# Patient Record
Sex: Male | Born: 1985 | Race: Black or African American | Hispanic: No | Marital: Single | Smoking: Current some day smoker
Health system: Southern US, Community
[De-identification: ages and names within clinical notes are randomized; demographics above are authoritative.]

## PROBLEM LIST (undated history)

## (undated) DIAGNOSIS — F319 Bipolar disorder, unspecified: Secondary | ICD-10-CM

---
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
## 2005-06-23 ENCOUNTER — Emergency Department (HOSPITAL_COMMUNITY): Admission: EM | Admit: 2005-06-23 | Discharge: 2005-06-23 | Payer: Self-pay | Admitting: Emergency Medicine

## 2005-11-12 ENCOUNTER — Emergency Department (HOSPITAL_COMMUNITY): Admission: EM | Admit: 2005-11-12 | Discharge: 2005-11-12 | Payer: Self-pay | Admitting: Family Medicine

## 2007-02-11 ENCOUNTER — Emergency Department (HOSPITAL_COMMUNITY): Admission: EM | Admit: 2007-02-11 | Discharge: 2007-02-11 | Payer: Self-pay | Admitting: Emergency Medicine

## 2007-02-12 ENCOUNTER — Emergency Department (HOSPITAL_COMMUNITY): Admission: EM | Admit: 2007-02-12 | Discharge: 2007-02-12 | Payer: Self-pay | Admitting: Emergency Medicine

## 2008-09-05 ENCOUNTER — Emergency Department (HOSPITAL_COMMUNITY): Admission: EM | Admit: 2008-09-05 | Discharge: 2008-09-05 | Payer: Self-pay | Admitting: Family Medicine

## 2010-05-22 ENCOUNTER — Emergency Department (HOSPITAL_COMMUNITY)
Admission: EM | Admit: 2010-05-22 | Discharge: 2010-05-22 | Payer: Self-pay | Source: Home / Self Care | Admitting: Emergency Medicine

## 2010-12-16 ENCOUNTER — Emergency Department (HOSPITAL_COMMUNITY): Payer: Self-pay

## 2010-12-16 ENCOUNTER — Emergency Department (HOSPITAL_COMMUNITY)
Admission: EM | Admit: 2010-12-16 | Discharge: 2010-12-16 | Disposition: A | Discharge status: Home / Self Care | Payer: Self-pay | Attending: Emergency Medicine | Admitting: Emergency Medicine

## 2010-12-16 DIAGNOSIS — R5383 Other fatigue: Secondary | ICD-10-CM | POA: Insufficient documentation

## 2010-12-16 DIAGNOSIS — R109 Unspecified abdominal pain: Secondary | ICD-10-CM | POA: Insufficient documentation

## 2010-12-16 DIAGNOSIS — R070 Pain in throat: Secondary | ICD-10-CM | POA: Insufficient documentation

## 2010-12-16 DIAGNOSIS — R5381 Other malaise: Secondary | ICD-10-CM | POA: Insufficient documentation

## 2010-12-16 DIAGNOSIS — IMO0001 Reserved for inherently not codable concepts without codable children: Secondary | ICD-10-CM | POA: Insufficient documentation

## 2010-12-16 DIAGNOSIS — R079 Chest pain, unspecified: Secondary | ICD-10-CM | POA: Insufficient documentation

## 2010-12-16 DIAGNOSIS — R509 Fever, unspecified: Secondary | ICD-10-CM | POA: Insufficient documentation

## 2010-12-16 DIAGNOSIS — N509 Disorder of male genital organs, unspecified: Secondary | ICD-10-CM | POA: Insufficient documentation

## 2010-12-16 DIAGNOSIS — R1310 Dysphagia, unspecified: Secondary | ICD-10-CM | POA: Insufficient documentation

## 2010-12-16 LAB — CBC
MCH: 31 pg (ref 26.0–34.0)
MCV: 89.5 fL (ref 78.0–100.0)
Platelets: 262 10*3/uL (ref 150–400)
RDW: 14 % (ref 11.5–15.5)

## 2010-12-16 LAB — BASIC METABOLIC PANEL
CO2: 29 mEq/L (ref 19–32)
Calcium: 9.4 mg/dL (ref 8.4–10.5)
Creatinine, Ser: 1.07 mg/dL (ref 0.50–1.35)
GFR calc Af Amer: >60 mL/min (ref >60)

## 2010-12-16 LAB — DIFFERENTIAL
Eosinophils Absolute: 0 10*3/uL (ref 0.0–0.7)
Eosinophils Relative: 0 % (ref 0–5)
Lymphs Abs: 0.5 10*3/uL — ABNORMAL LOW (ref 0.7–4.0)
Monocytes Absolute: 0.7 10*3/uL (ref 0.1–1.0)
Monocytes Relative: 9 % (ref 3–12)

## 2010-12-16 LAB — URINALYSIS, ROUTINE W REFLEX MICROSCOPIC
Hgb urine dipstick: NEGATIVE
Protein, ur: NEGATIVE mg/dL
Urobilinogen, UA: 1 mg/dL (ref 0.0–1.0)

## 2011-03-27 ENCOUNTER — Emergency Department (HOSPITAL_COMMUNITY): Payer: No Typology Code available for payment source

## 2011-03-27 ENCOUNTER — Encounter: Payer: Self-pay | Admitting: Emergency Medicine

## 2011-03-27 ENCOUNTER — Emergency Department (HOSPITAL_COMMUNITY)
Admission: EM | Admit: 2011-03-27 | Discharge: 2011-03-27 | Disposition: A | Discharge status: Home / Self Care | Payer: No Typology Code available for payment source | Attending: Emergency Medicine | Admitting: Emergency Medicine

## 2011-03-27 DIAGNOSIS — F17200 Nicotine dependence, unspecified, uncomplicated: Secondary | ICD-10-CM | POA: Insufficient documentation

## 2011-03-27 DIAGNOSIS — M25519 Pain in unspecified shoulder: Secondary | ICD-10-CM | POA: Insufficient documentation

## 2011-03-27 DIAGNOSIS — F319 Bipolar disorder, unspecified: Secondary | ICD-10-CM | POA: Insufficient documentation

## 2011-03-27 DIAGNOSIS — S42123A Displaced fracture of acromial process, unspecified shoulder, initial encounter for closed fracture: Secondary | ICD-10-CM | POA: Insufficient documentation

## 2011-03-27 HISTORY — DX: Bipolar disorder, unspecified: F31.9

## 2011-03-27 MED ORDER — OXYCODONE-ACETAMINOPHEN 5-325 MG PO TABS: 1.0000 | ORAL_TABLET | Freq: Four times a day (QID) | ORAL | Status: AC | PRN

## 2011-03-27 MED ORDER — CYCLOBENZAPRINE HCL 10 MG PO TABS: 10.0000 mg | ORAL_TABLET | Freq: Two times a day (BID) | ORAL | Status: AC | PRN

## 2011-03-27 MED ORDER — OXYCODONE-ACETAMINOPHEN 5-325 MG PO TABS: 1.0000 | ORAL_TABLET | Freq: Four times a day (QID) | ORAL | Status: DC | PRN

## 2011-03-27 NOTE — ED Notes (Signed)
Pt NAD, resp e/u, AOx4, ambulatory with steady gait. Ortho tech place right arm sliing on pt. Pt tolerated well. Pt states understanding of discharge instructions and denies questions at time of discharge.
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
## 2011-03-27 NOTE — ED Provider Notes (Signed)
Medical screening examination/treatment/procedure(s) were performed by non-physician practitioner and as supervising physician I was immediately available for consultation/collaboration.   Laray Anger, DO 03/27/11 2356

## 2011-03-27 NOTE — Progress Notes (Signed)
Orthopedic Tech Progress Note Patient Details:  Bob Barnett 12-19-1985 [id number]  Other Ortho Devices Type of Ortho Device: Other (comment) (sling) Ortho Device Location: left arm Ortho Device Interventions: Application   Jasper Hanf 03/27/2011, 7:30 PM
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
## 2012-08-27 ENCOUNTER — Emergency Department (HOSPITAL_COMMUNITY)
Admission: EM | Admit: 2012-08-27 | Discharge: 2012-08-27 | Disposition: A | Discharge status: Home / Self Care | Payer: Self-pay | Attending: Emergency Medicine | Admitting: Emergency Medicine

## 2012-08-27 ENCOUNTER — Encounter (HOSPITAL_COMMUNITY): Payer: Self-pay | Admitting: Emergency Medicine

## 2012-08-27 DIAGNOSIS — IMO0002 Reserved for concepts with insufficient information to code with codable children: Secondary | ICD-10-CM | POA: Insufficient documentation

## 2012-08-27 DIAGNOSIS — Y9389 Activity, other specified: Secondary | ICD-10-CM | POA: Insufficient documentation

## 2012-08-27 DIAGNOSIS — Z8659 Personal history of other mental and behavioral disorders: Secondary | ICD-10-CM | POA: Insufficient documentation

## 2012-08-27 DIAGNOSIS — X503XXA Overexertion from repetitive movements, initial encounter: Secondary | ICD-10-CM | POA: Insufficient documentation

## 2012-08-27 DIAGNOSIS — Y92009 Unspecified place in unspecified non-institutional (private) residence as the place of occurrence of the external cause: Secondary | ICD-10-CM | POA: Insufficient documentation

## 2012-08-27 DIAGNOSIS — M549 Dorsalgia, unspecified: Secondary | ICD-10-CM

## 2012-08-27 DIAGNOSIS — F17200 Nicotine dependence, unspecified, uncomplicated: Secondary | ICD-10-CM | POA: Insufficient documentation

## 2012-08-27 MED ORDER — IBUPROFEN 800 MG PO TABS: 800.0000 mg | ORAL_TABLET | Freq: Three times a day (TID) | ORAL | Status: DC

## 2012-08-27 MED ORDER — CYCLOBENZAPRINE HCL 10 MG PO TABS: 10.0000 mg | ORAL_TABLET | Freq: Two times a day (BID) | ORAL | Status: DC | PRN

## 2012-08-27 NOTE — ED Provider Notes (Signed)
History     CSN: [id number]  Arrival date & time 08/27/12  4098   First MD Initiated Contact with Patient 08/27/12 1116      Chief Complaint  Patient presents with  . Back Pain    (Consider location/radiation/quality/duration/timing/severity/associated sxs/prior treatment) HPI Comments: Patient is a 27 year old male with no significant past medical history who presents for thoracic back pain with onset 3 days ago. Patient states that he was mowing the lawn and heard a pop in his back while pushing a lawnmower. Patient states that he has had an intermittent sharp pain in his mid back that radiates up and down his back; worse with movement and improved with rest. Patient denies direct trauma to his back, saddle anesthesia, as well as bowel or bladder incontinence. Patient has been ambulatory since the incident. He denies fever, chest pain, shortness of breath, nausea or vomiting, abdominal pain, and numbness or tingling in his extremities.  Patient is a 27 y.o. male presenting with back pain. The history is provided by the patient. No language interpreter was used.  Back Pain Associated symptoms: no fever, no numbness and no weakness     Past Medical History  Diagnosis Date  . Bipolar 1 disorder     History reviewed. No pertinent past surgical history.  History reviewed. No pertinent family history.  History  Substance Use Topics  . Smoking status: Current Some Day Smoker  . Smokeless tobacco: Not on file  . Alcohol Use: Yes     Comment: social      Review of Systems  Constitutional: Negative for fever.  Musculoskeletal: Positive for back pain. Negative for joint swelling and gait problem.  Neurological: Negative for weakness and numbness.  All other systems reviewed and are negative.    Allergies  Review of patient's allergies indicates no known allergies.  Home Medications   Current Outpatient Rx  Name  Route  Sig  Dispense  Refill  . cyclobenzaprine (FLEXERIL)  10 MG tablet   Oral   Take 1 tablet (10 mg total) by mouth 2 (two) times daily as needed for muscle spasms.   20 tablet   0   . ibuprofen (ADVIL,MOTRIN) 800 MG tablet   Oral   Take 1 tablet (800 mg total) by mouth 3 (three) times daily.   21 tablet   0     BP 123/76  Pulse 66  Temp(Src) 98.1 F (36.7 C) (Oral)  Resp 18  SpO2 100%  Physical Exam  Nursing note and vitals reviewed. Constitutional: He is oriented to person, place, and time. He appears well-developed and well-nourished. No distress.  HENT:  Head: Normocephalic and atraumatic.  Eyes: Conjunctivae are normal. No scleral icterus.  Neck: Normal range of motion. Neck supple.  Cardiovascular: Normal rate, regular rhythm and intact distal pulses.   Pulmonary/Chest: Effort normal. No respiratory distress.  Musculoskeletal:  Patient has tenderness to palpation of his mid thoracic back as well as tenderness to palpation of his thoracic paraspinal muscles. He exhibits no decreased range of motion, able to demonstrate full flexion, extension, lateral bending, and twisting of his upper body. No bony step-offs or palpable deformities appreciated.  Neurological: He is alert and oriented to person, place, and time.  No sensory or motor deficits appreciated. DTRs normal and symmetric.  Skin: Skin is warm and dry. No rash noted. He is not diaphoretic. No erythema.  Psychiatric: He has a normal mood and affect. His behavior is normal.    ED Course  Procedures (including critical care time)  Labs Reviewed - No data to display No results found.   1. Back pain, acute     MDM  Uncomplicated mid back pain x3 days. Patient is neurovascularly intact with full range of motion of his mid and low back. No palpable deformities or step-offs appreciated and patient is ambulatory with normal gait. No sensory or motor deficits appreciated in DTRs normal and symmetric. Given physical exam findings, low suspicion for fracture and do not  believe imaging is warranted at this time; no hx of direct trauma. Patient is stable for discharge with orthopedic followup as well as ice and ibuprofen; Flexeril given as needed for symptoms. Indications for ED return discussed. Patient states comfort and understanding with this discharge plan with no unaddressed concerns.        Antony Madura, PA-C 08/27/12 1152

## 2012-08-27 NOTE — ED Notes (Signed)
Patient states he was doing yardwork on Saturday and heard his back pop.  Patient c/o mid-thoracic pain.  No other associated symptoms reported.

## 2012-08-28 NOTE — ED Provider Notes (Signed)
Medical screening examination/treatment/procedure(s) were performed by non-physician practitioner and as supervising physician I was immediately available for consultation/collaboration.    Vida Roller, MD 08/28/12 [phone]
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
## 2013-12-08 ENCOUNTER — Emergency Department (INDEPENDENT_AMBULATORY_CARE_PROVIDER_SITE_OTHER)
Admission: EM | Admit: 2013-12-08 | Discharge: 2013-12-08 | Disposition: A | Discharge status: Home / Self Care | Payer: Self-pay | Source: Home / Self Care | Attending: Family Medicine | Admitting: Family Medicine

## 2013-12-08 ENCOUNTER — Encounter (HOSPITAL_COMMUNITY): Payer: Self-pay | Admitting: Emergency Medicine

## 2013-12-08 DIAGNOSIS — S01511A Laceration without foreign body of lip, initial encounter: Secondary | ICD-10-CM

## 2013-12-08 DIAGNOSIS — S01501A Unspecified open wound of lip, initial encounter: Secondary | ICD-10-CM

## 2013-12-08 NOTE — Discharge Instructions (Signed)
Thank you for coming in today. Come back as needed.    Facial Laceration A facial laceration is a cut on the face. These injuries can be painful and cause bleeding. Some cuts may need to be closed with stitches (sutures), skin adhesive strips, or wound glue. Cuts usually heal quickly but can leave a scar. It can take 1-2 years for the scar to go away completely. HOME CARE   Only take medicines as told by your doctor.  Follow your doctor's instructions for wound care. For Stitches:  Keep the cut clean and dry.  If you have a bandage (dressing), change it at least once a day. Change the bandage if it gets wet or dirty, or as told by your doctor.  Wash the cut with soap and water 2 times a day. Rinse the cut with water. Pat it dry with a clean towel.  Put a thin layer of medicated cream on the cut as told by your doctor.  You may shower after the first 24 hours. Do not soak the cut in water until the stitches are removed.  Have your stitches removed as told by your doctor.  Do not wear any makeup until a few days after your stitches are removed. For Skin Adhesive Strips:  Keep the cut clean and dry.  Do not get the strips wet. You may take a bath, but be careful to keep the cut dry.  If the cut gets wet, pat it dry with a clean towel.  The strips will fall off on their own. Do not remove the strips that are still stuck to the cut. For Wound Glue:  You may shower or take baths. Do not soak or scrub the cut. Do not swim. Avoid heavy sweating until the glue falls off on its own. After a shower or bath, pat the cut dry with a clean towel.  Do not put medicine or makeup on your cut until the glue falls off.  If you have a bandage, do not put tape over the glue.  Avoid lots of sunlight or tanning lamps until the glue falls off.  The glue will fall off on its own in 5-10 days. Do not pick at the glue. After Healing: Put sunscreen on the cut for the first year to reduce your  scar. GET HELP RIGHT AWAY IF:   Your cut area gets red, painful, or puffy (swollen).  You see a yellowish-white fluid (pus) coming from the cut.  You have chills or a fever. MAKE SURE YOU:   Understand these instructions.  Will watch your condition.  Will get help right away if you are not doing well or get worse. Document Released: 10/05/2007 Document Revised: 02/06/2013 Document Reviewed: 11/29/2012 [facility] Patient Information 2015 [employer], [place]. This information is not intended to replace advice given to you by your health care provider. Make sure you discuss any questions you have with your health care provider.

## 2013-12-08 NOTE — ED Provider Notes (Signed)
Bob Barnett is a 28 y.o. male who presents to Urgent Care today for lip laceration. Patient was elbowed in the right upper lip today while playing basketball. He feels well otherwise. He denies any fever or chills. Tetanus show recently.    Past Medical History  Diagnosis Date  . Bipolar 1 disorder    History  Substance Use Topics  . Smoking status: Current Some Day Smoker  . Smokeless tobacco: Not on file  . Alcohol Use: Yes     Comment: social   ROS as above Medications: No current facility-administered medications for this encounter.   Current Outpatient Prescriptions  Medication Sig Dispense Refill  . cyclobenzaprine (FLEXERIL) 10 MG tablet Take 1 tablet (10 mg total) by mouth 2 (two) times daily as needed for muscle spasms.  20 tablet  0  . ibuprofen (ADVIL,MOTRIN) 800 MG tablet Take 1 tablet (800 mg total) by mouth 3 (three) times daily.  21 tablet  0    Exam:  BP 121/81  Pulse 68  Temp(Src) 98.3 F (36.8 C) (Oral)  SpO2 99% Gen: Well NAD Skin: <1cm laceration right lateral upper lip. Crosses the [place]. Shallow. No bleeding.   Laceration Repair: Area cleaned.  Dermabond applied and allowed to dry  Pt tolerated the procedure well.   No results found for this or any previous visit (from the past 24 hour(s)). No results found.  Assessment and Plan: 28 y.o. male with Right lip laceration repaired with dermabond.  F/u PRN  Discussed warning signs or symptoms. Please see discharge instructions. Patient expresses understanding.   This note was created using Conservation officer, historic buildingsDragon voice recognition software. Any transcription errors are unintended.    [name], MD 12/08/13 [phone]

## 2013-12-08 NOTE — ED Notes (Signed)
Patient c/o lip laceration to right upper corner of lip onset today. Patient reports he was elbowed in the face while playing basketball. Patient is alert and oriented and in no acute distress.

## 2014-12-27 ENCOUNTER — Encounter (HOSPITAL_COMMUNITY): Payer: Self-pay

## 2014-12-27 ENCOUNTER — Emergency Department (HOSPITAL_COMMUNITY)
Admission: EM | Admit: 2014-12-27 | Discharge: 2014-12-27 | Disposition: A | Discharge status: Home / Self Care | Payer: No Typology Code available for payment source | Attending: Emergency Medicine | Admitting: Emergency Medicine

## 2014-12-27 DIAGNOSIS — Z720 Tobacco use: Secondary | ICD-10-CM | POA: Diagnosis not present

## 2014-12-27 DIAGNOSIS — Y9389 Activity, other specified: Secondary | ICD-10-CM | POA: Insufficient documentation

## 2014-12-27 DIAGNOSIS — S199XXA Unspecified injury of neck, initial encounter: Secondary | ICD-10-CM | POA: Diagnosis not present

## 2014-12-27 DIAGNOSIS — S3992XA Unspecified injury of lower back, initial encounter: Secondary | ICD-10-CM | POA: Insufficient documentation

## 2014-12-27 DIAGNOSIS — Y92410 Unspecified street and highway as the place of occurrence of the external cause: Secondary | ICD-10-CM | POA: Insufficient documentation

## 2014-12-27 DIAGNOSIS — Y998 Other external cause status: Secondary | ICD-10-CM | POA: Insufficient documentation

## 2014-12-27 DIAGNOSIS — Z8659 Personal history of other mental and behavioral disorders: Secondary | ICD-10-CM | POA: Diagnosis not present

## 2014-12-27 DIAGNOSIS — Z791 Long term (current) use of non-steroidal anti-inflammatories (NSAID): Secondary | ICD-10-CM | POA: Diagnosis not present

## 2014-12-27 MED ORDER — CYCLOBENZAPRINE HCL 10 MG PO TABS: 10.0000 mg | ORAL_TABLET | Freq: Two times a day (BID) | ORAL | Status: DC | PRN

## 2014-12-27 MED ORDER — IBUPROFEN 800 MG PO TABS: 800.0000 mg | ORAL_TABLET | Freq: Three times a day (TID) | ORAL | Status: AC

## 2014-12-27 NOTE — Discharge Instructions (Signed)
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
## 2015-11-30 ENCOUNTER — Encounter (HOSPITAL_COMMUNITY): Payer: Self-pay | Admitting: Emergency Medicine

## 2015-11-30 ENCOUNTER — Emergency Department (HOSPITAL_COMMUNITY)
Admission: EM | Admit: 2015-11-30 | Discharge: 2015-11-30 | Disposition: A | Discharge status: Home / Self Care | Payer: No Typology Code available for payment source | Attending: Emergency Medicine | Admitting: Emergency Medicine

## 2015-11-30 DIAGNOSIS — R51 Headache: Secondary | ICD-10-CM | POA: Diagnosis not present

## 2015-11-30 DIAGNOSIS — F17200 Nicotine dependence, unspecified, uncomplicated: Secondary | ICD-10-CM | POA: Insufficient documentation

## 2015-11-30 DIAGNOSIS — Y999 Unspecified external cause status: Secondary | ICD-10-CM | POA: Insufficient documentation

## 2015-11-30 DIAGNOSIS — Y92410 Unspecified street and highway as the place of occurrence of the external cause: Secondary | ICD-10-CM | POA: Diagnosis not present

## 2015-11-30 DIAGNOSIS — S3992XA Unspecified injury of lower back, initial encounter: Secondary | ICD-10-CM | POA: Diagnosis present

## 2015-11-30 DIAGNOSIS — S39012A Strain of muscle, fascia and tendon of lower back, initial encounter: Secondary | ICD-10-CM | POA: Diagnosis not present

## 2015-11-30 DIAGNOSIS — Y939 Activity, unspecified: Secondary | ICD-10-CM | POA: Insufficient documentation

## 2015-11-30 MED ORDER — ACETAMINOPHEN 500 MG PO TABS
1000.0000 mg | ORAL_TABLET | Freq: Once | ORAL | Status: AC
  Administered 2015-11-30: 1000 mg via ORAL
  Filled 2015-11-30: qty 2
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
## 2015-11-30 NOTE — Discharge Instructions (Signed)
Take Tylenol for pain as directed. See an urgent care center if you continue to have significant pain in 3 or 4 days. Return if concern for any reason.

## 2022-05-05 ENCOUNTER — Encounter (HOSPITAL_COMMUNITY): Payer: Self-pay | Admitting: *Deleted

## 2022-05-05 ENCOUNTER — Ambulatory Visit (HOSPITAL_COMMUNITY)
Admission: EM | Admit: 2022-05-05 | Discharge: 2022-05-05 | Disposition: A | Discharge status: Home / Self Care | Payer: Self-pay

## 2022-05-05 DIAGNOSIS — K469 Unspecified abdominal hernia without obstruction or gangrene: Secondary | ICD-10-CM

## 2022-05-05 NOTE — ED Triage Notes (Signed)
Pt states that he had lower right sided abdominal pain. The pain radiates from his LLQ to his groin. He seen a lump/bulge on his LLQ and he pushed and the lump went back in and the pain was gone. He looked it up and he feels he has a hernia. He does do a lot pf lifting heavy things at work.

## 2022-05-05 NOTE — ED Provider Notes (Signed)
[facility]    CSN: [id number] Arrival date & time: 05/05/22  0855     History   Chief Complaint Chief Complaint  Patient presents with   Abdominal Pain    HPI Bob Barnett is a 37 y.o. male.  Presents with several week history of bulge in the right lower abdomen/groin He feels it sometimes extends into his testicle. Reports over the last week it has been a little painful.  He has been able to push it back in each time and the pain goes away. He does a lot of heavy lifting at work which he feels aggravates his symptoms. Denies any fevers.  No abdominal pain, nausea, vomiting, constipation. Not having pain today  Denies history of hernia.  Past Medical History:  Diagnosis Date   Bipolar 1 disorder ([facility])     There are no problems to display for this patient.   History reviewed. No pertinent surgical history.   Home Medications    Prior to Admission medications   Medication Sig Start Date End Date Taking? Authorizing Provider  ARIPiprazole (ABILIFY) 5 MG tablet Take 5 mg by mouth daily.    [provider]  cyclobenzaprine (FLEXERIL) 10 MG tablet Take 1 tablet (10 mg total) by mouth 2 (two) times daily as needed for muscle spasms. 12/27/14   Domenic Moras, PA-C  ibuprofen (ADVIL,MOTRIN) 800 MG tablet Take 1 tablet (800 mg total) by mouth 3 (three) times daily. 12/27/14   Domenic Moras, PA-C    Family History History reviewed. No pertinent family history.  Social History Social History   Tobacco Use   Smoking status: Some Days   Smokeless tobacco: Never  Substance Use Topics   Alcohol use: Yes    Comment: social   Drug use: No     Allergies   Patient has no known allergies.   Review of Systems Review of Systems As per HPI  Physical Exam Triage Vital Signs ED Triage Vitals  Enc Vitals Group     BP 05/05/22 1054 (!) 150/92     Pulse Rate 05/05/22 1054 65     Resp 05/05/22 1054 18     Temp 05/05/22 1054 97.9 F (36.6 C)     Temp  Source 05/05/22 1054 Oral     SpO2 05/05/22 1054 98 %     Weight --      Height --      Head Circumference --      Peak Flow --      Pain Score 05/05/22 1053 0     Pain Loc --      Pain Edu? --      Excl. in [place]? --    No data found.  Updated Vital Signs BP (!) 150/92 (BP Location: Left Arm)   Pulse 65   Temp 97.9 F (36.6 C) (Oral)   Resp 18   SpO2 98%      Physical Exam Vitals and nursing note reviewed.  Constitutional:      Appearance: Normal appearance.  HENT:     Mouth/Throat:     Mouth: Mucous membranes are moist.     Pharynx: Oropharynx is clear.  Eyes:     Conjunctiva/sclera: Conjunctivae normal.  Cardiovascular:     Rate and Rhythm: Normal rate and regular rhythm.     Heart sounds: Normal heart sounds.  Pulmonary:     Effort: Pulmonary effort is normal. No respiratory distress.     Breath sounds: Normal breath sounds.  Abdominal:     General: Bowel sounds are normal. There is no distension.     Palpations: Abdomen is soft. There is no mass.     Tenderness: There is no abdominal tenderness. There is no right CVA tenderness, left CVA tenderness, guarding or rebound.     Hernia: No hernia is present.     Comments: I do not see or palpate a bulge or mass of the abdominal wall.  No obvious bulge of the inguinal canals.  Abdomen is overall nontender  Musculoskeletal:        General: Normal range of motion.  Skin:    General: Skin is warm and dry.  Neurological:     Mental Status: He is alert and oriented to person, place, and time.     UC Treatments / Results  Labs (all labs ordered are listed, but only abnormal results are displayed) Labs Reviewed - No data to display  EKG  Radiology No results found.  Procedures Procedures   Medications Ordered in UC Medications - No data to display  Initial Impression / Assessment and Plan / UC Course  I have reviewed the triage vital signs and the nursing notes.  Pertinent labs & imaging results that were  available during my care of the patient were reviewed by me and considered in my medical decision making (see chart for details).  He is well-appearing today without pain No hernia is appreciated on exam although patient description possible hernia Set up patient with PCP, appointment is next week.  Additionally provided with general surgery information, can contact to make an appointment for consult.  We discussed signs and symptoms that would warrant a trip to the emergency department.  It is reassuring he is not having pain and the bulge is able to easily pushed back in.  He understands with severe pain or swelling of the abdomen/testicle, needs to go to the emergency department. Discussed trying hernia belt for comfort. Also provided employee health and wellness info, can contact them regarding work restrictions. Heavy lifting likely contributing. Patient agrees to plan  Final Clinical Impressions(s) / UC Diagnoses   Final diagnoses:  Abdominal hernia without obstruction and without gangrene, recurrence not specified, unspecified hernia type     Discharge Instructions      Please follow up with your primary care provider at your upcoming visit.  You can call the surgery center to schedule an appointment for consult.  Please monitor for any change in symptoms. With severe pain or if you are unable to press the hernia back in, please go to the emergency department.   Call the [facility] to make an appointment regarding work restrictions.     ED Prescriptions   None    PDMP not reviewed this encounter.   Christon Parada, Vernice Jefferson 05/05/22 2056

## 2022-05-05 NOTE — Discharge Instructions (Addendum)
Please follow up with your primary care provider at your upcoming visit.  You can call the surgery center to schedule an appointment for consult.  Please monitor for any change in symptoms. With severe pain or if you are unable to press the hernia back in, please go to the emergency department.   Call the [facility] to make an appointment regarding work restrictions.

## 2022-05-11 ENCOUNTER — Ambulatory Visit: Payer: Self-pay | Admitting: Family Medicine

## 2023-01-12 ENCOUNTER — Telehealth (HOSPITAL_COMMUNITY): Payer: Self-pay

## 2023-01-12 ENCOUNTER — Encounter (HOSPITAL_COMMUNITY): Payer: Self-pay

## 2023-01-12 ENCOUNTER — Ambulatory Visit (HOSPITAL_COMMUNITY)
Admission: EM | Admit: 2023-01-12 | Discharge: 2023-01-12 | Disposition: A | Discharge status: Home / Self Care | Payer: Self-pay | Attending: Nurse Practitioner | Admitting: Nurse Practitioner

## 2023-01-12 DIAGNOSIS — M545 Low back pain, unspecified: Secondary | ICD-10-CM

## 2023-01-12 MED ORDER — KETOROLAC TROMETHAMINE 30 MG/ML IJ SOLN
30.0000 mg | Freq: Once | INTRAMUSCULAR | Status: AC
  Administered 2023-01-12: 30 mg via INTRAMUSCULAR

## 2023-01-12 MED ORDER — CYCLOBENZAPRINE HCL 10 MG PO TABS: 10.0000 mg | ORAL_TABLET | Freq: Two times a day (BID) | ORAL | 0 refills | Status: AC | PRN

## 2023-01-12 MED ORDER — CYCLOBENZAPRINE HCL 10 MG PO TABS: 10.0000 mg | ORAL_TABLET | Freq: Two times a day (BID) | ORAL | 0 refills | Status: DC | PRN

## 2023-01-12 MED ORDER — KETOROLAC TROMETHAMINE 30 MG/ML IJ SOLN
INTRAMUSCULAR | Status: AC
  Filled 2023-01-12: qty 1
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
